# Patient Record
Sex: Male | Born: 1965 | Race: White | Hispanic: No | Marital: Married | Smoking: Never smoker
Health system: Southern US, Community
[De-identification: ages and names within clinical notes are randomized; demographics above are authoritative.]

## PROBLEM LIST (undated history)

## (undated) DIAGNOSIS — M109 Gout, unspecified: Secondary | ICD-10-CM

## (undated) DIAGNOSIS — I10 Essential (primary) hypertension: Secondary | ICD-10-CM

## (undated) DIAGNOSIS — N183 Chronic kidney disease, stage 3 unspecified: Secondary | ICD-10-CM

## (undated) DIAGNOSIS — N251 Nephrogenic diabetes insipidus: Secondary | ICD-10-CM

## (undated) DIAGNOSIS — N189 Chronic kidney disease, unspecified: Secondary | ICD-10-CM

## (undated) HISTORY — DX: Chronic kidney disease, unspecified: N18.9

## (undated) HISTORY — DX: Gout, unspecified: M10.9

## (undated) HISTORY — DX: Nephrogenic diabetes insipidus: N25.1

## (undated) HISTORY — DX: Chronic kidney disease, stage 3 unspecified: N18.30

## (undated) HISTORY — DX: Essential (primary) hypertension: I10

## (undated) HISTORY — DX: Chronic kidney disease, stage 3 (moderate): N18.3

---

## 2004-06-23 ENCOUNTER — Encounter: Admission: RE | Admit: 2004-06-23 | Discharge: 2004-06-23 | Payer: Self-pay | Admitting: Nephrology

## 2005-02-03 IMAGING — US US RETROPERITONEAL COMPLETE
1 series · 14 of 25 positions shown · non-contrast
Comparison: none

CLINICAL DATA: Chronic renal failure.  History diabetes insipidus.  
 RENAL ULTRASOUND 
 Bilateral renal size is slightly small with the right kidney in lower limits of normal of the left kidney with the right kidney measuring 8.4 cm long and the left kidney 9.7 cm long.  No hydronephrosis is seen.  Multiple bilateral renal cystic foci are seen, the largest at the lower pole right measures up to 1.7 cm and the largest at the left kidney mid region measuring up to 1.7 cm.  Urinary bladder is unremarkable. 
 IMPRESSION
 1.  Slightly smaller left kidney though no significant cortical increased echogenicity nor atrophy is seen.  
 2.  Small bilateral renal cystic foci, the largest bilaterally measuring up to 1.7 cm. 
 3.  Otherwise, no significant abnormality.

[Series 1: unknown · 0.27mm/px · 14 of 35 slices shown]
[im 1/35]
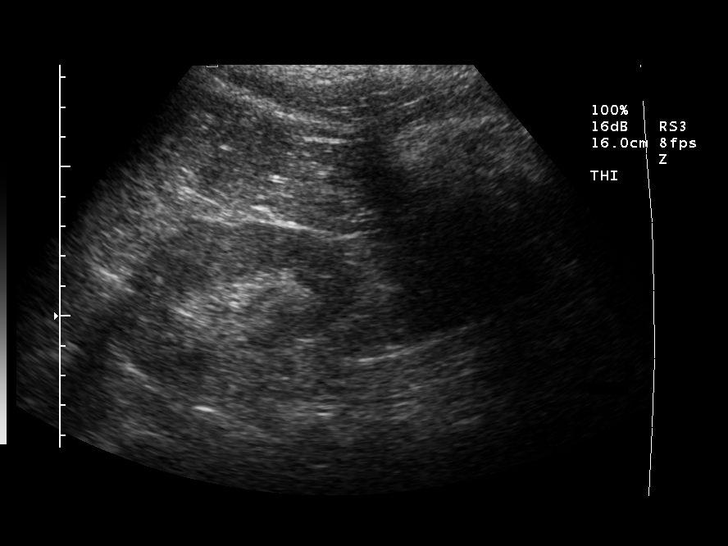
[im 3/35]
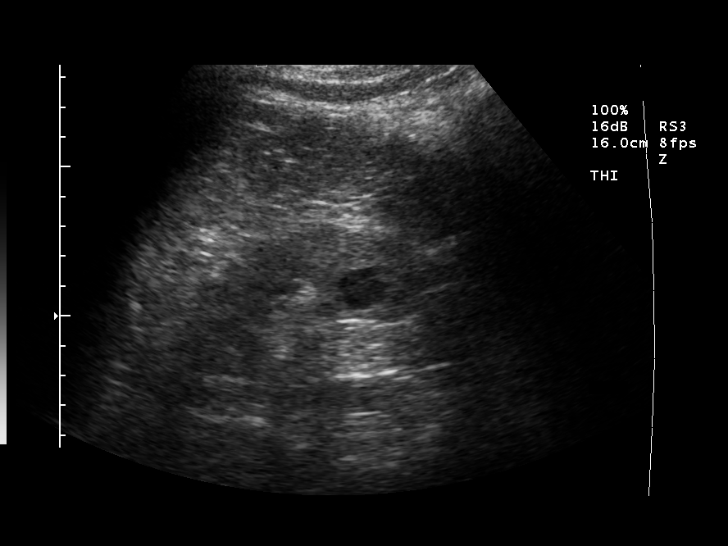
[im 6/35]
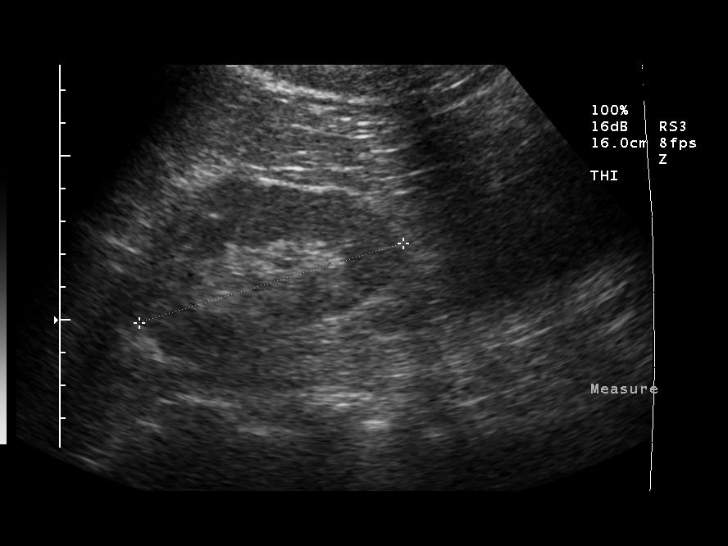
[im 9/35]
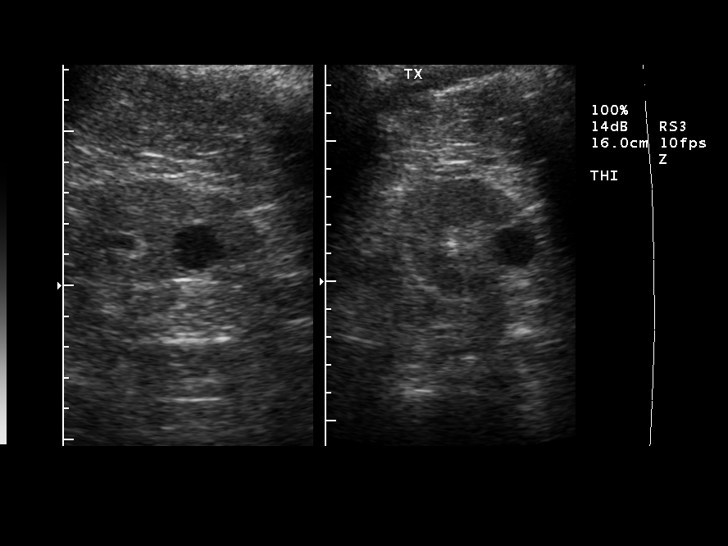
[im 12/35]
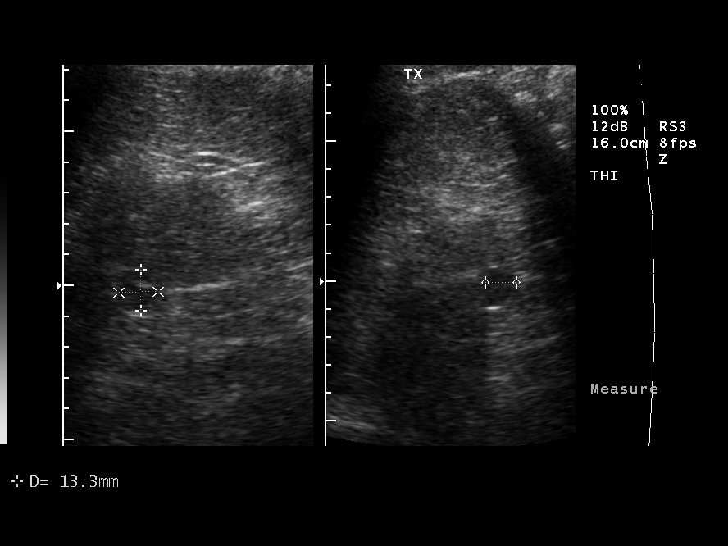
[im 13/35]
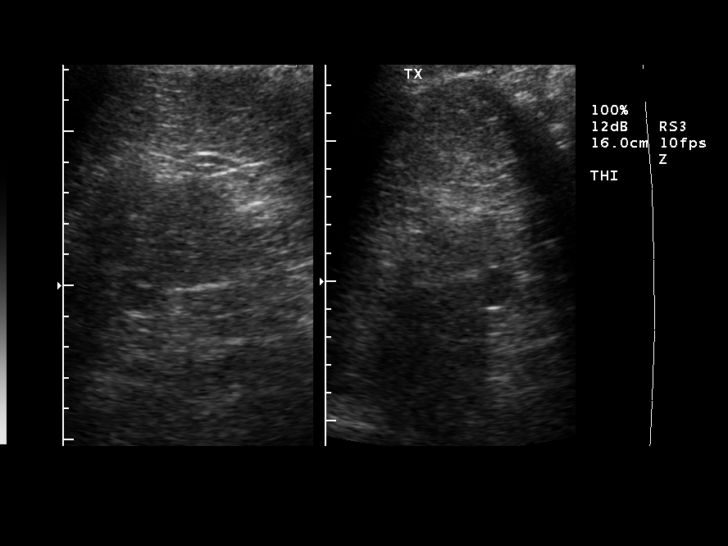
[im 16/35]
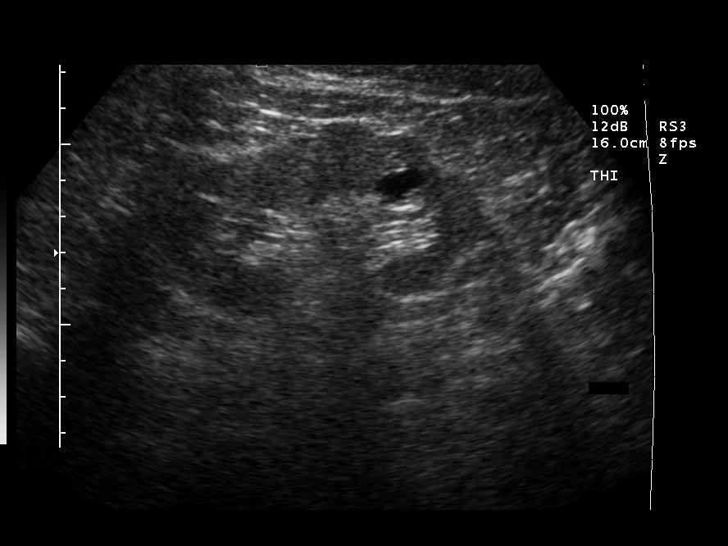
[im 19/35]
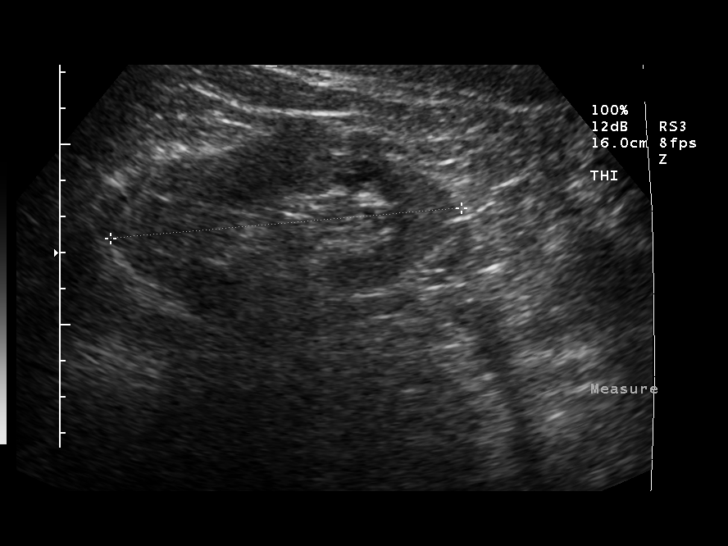
[im 22/35]
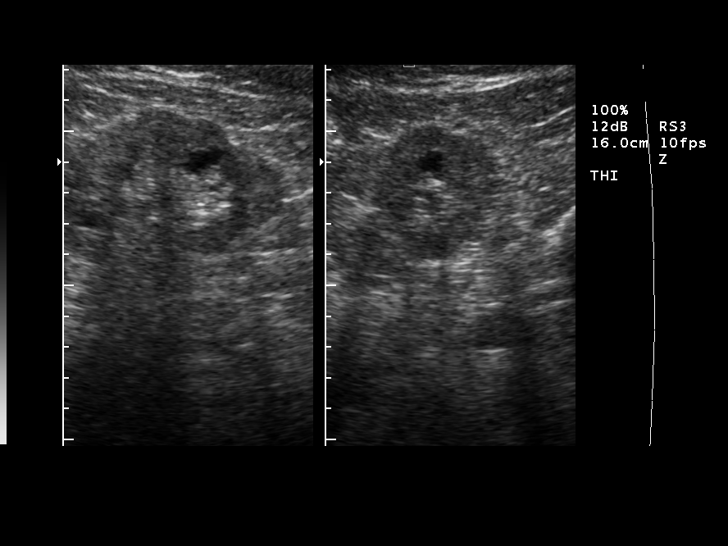
[im 23/35]
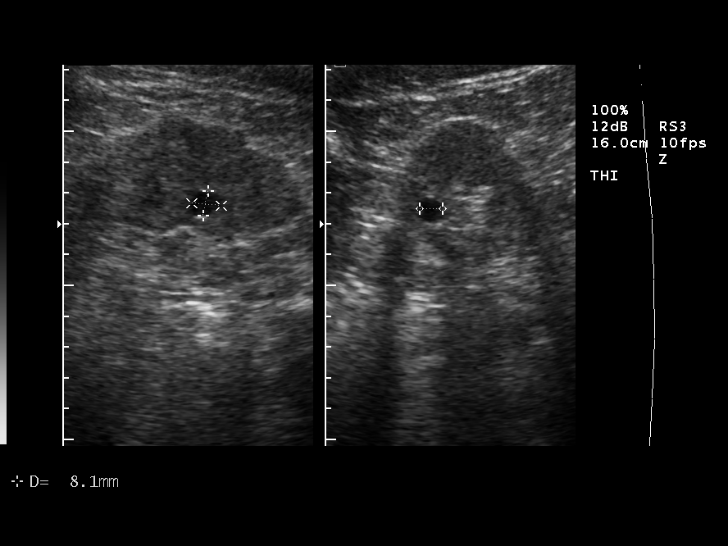
[im 26/35]
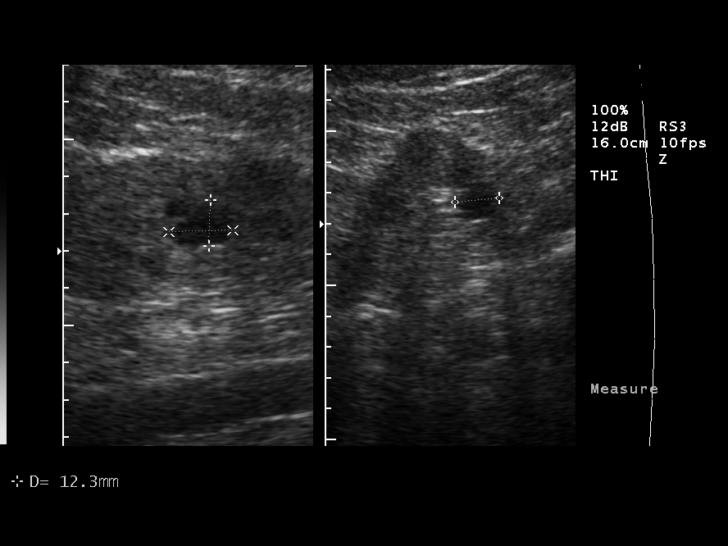
[im 29/35]
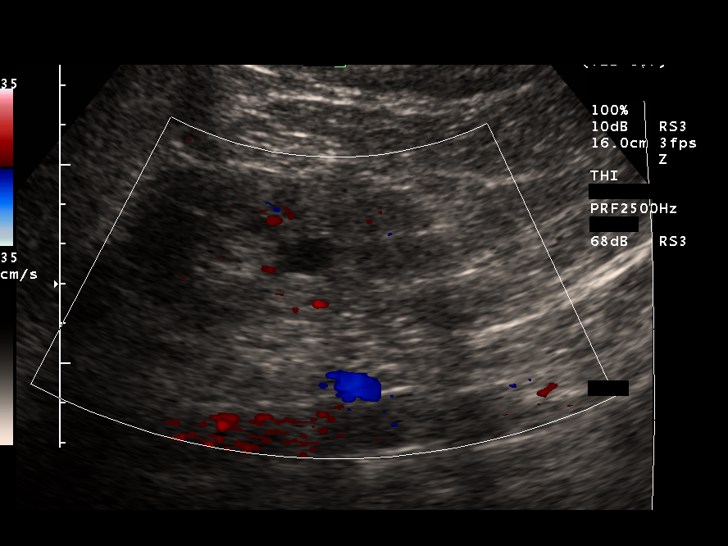
[im 32/35]
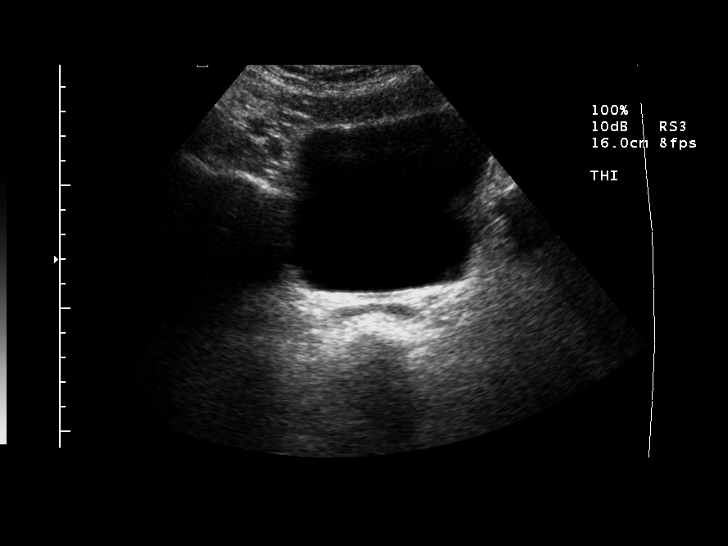
[im 35/35]
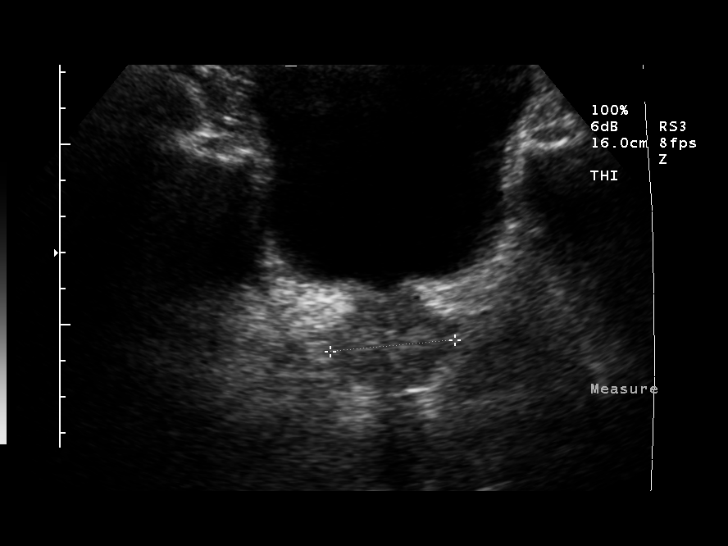

[14 of 25 positions shown; findings below may reference images not displayed]

## 2013-11-17 ENCOUNTER — Other Ambulatory Visit: Payer: Self-pay | Admitting: *Deleted

## 2013-11-17 DIAGNOSIS — Z01810 Encounter for preprocedural cardiovascular examination: Secondary | ICD-10-CM

## 2013-11-17 DIAGNOSIS — N186 End stage renal disease: Secondary | ICD-10-CM

## 2013-11-20 ENCOUNTER — Encounter: Payer: Self-pay | Admitting: Vascular Surgery

## 2013-12-10 ENCOUNTER — Encounter: Payer: Self-pay | Admitting: Vascular Surgery

## 2013-12-11 ENCOUNTER — Ambulatory Visit (HOSPITAL_COMMUNITY)
Admission: RE | Admit: 2013-12-11 | Discharge: 2013-12-11 | Disposition: A | Discharge status: Home / Self Care | Payer: 59 | Source: Ambulatory Visit | Attending: Vascular Surgery | Admitting: Vascular Surgery

## 2013-12-11 ENCOUNTER — Ambulatory Visit (INDEPENDENT_AMBULATORY_CARE_PROVIDER_SITE_OTHER)
Admission: RE | Admit: 2013-12-11 | Discharge: 2013-12-11 | Disposition: A | Discharge status: Home / Self Care | Payer: 59 | Source: Ambulatory Visit | Attending: Vascular Surgery | Admitting: Vascular Surgery

## 2013-12-11 ENCOUNTER — Ambulatory Visit (INDEPENDENT_AMBULATORY_CARE_PROVIDER_SITE_OTHER): Payer: 59 | Admitting: Vascular Surgery

## 2013-12-11 ENCOUNTER — Encounter: Payer: Self-pay | Admitting: Vascular Surgery

## 2013-12-11 VITALS — BP 143/94 | HR 52 | Ht 66.5 in | Wt 176.0 lb

## 2013-12-11 DIAGNOSIS — Z01810 Encounter for preprocedural cardiovascular examination: Secondary | ICD-10-CM

## 2013-12-11 DIAGNOSIS — N186 End stage renal disease: Secondary | ICD-10-CM

## 2013-12-11 DIAGNOSIS — N184 Chronic kidney disease, stage 4 (severe): Secondary | ICD-10-CM

## 2013-12-11 NOTE — Progress Notes (Signed)
Referred by:  Elvis CoilMartin Webb, MD  Reason for referral: New access  History of Present Illness  Richard Garcia is a 48 y.o. (1965/12/23) male who presents for evaluation for permanent access.  The patient is right hand dominant.  The patient has not had previous access procedures.  Previous central venous cannulation procedures include: none.  The patient has never had a PPM placed.  By report, his is CKD stage IV.  Past Medical History  Diagnosis Date  . Chronic kidney disease   . Hypertension   . Chronic kidney disease, stage III (moderate)   . Gout, unspecified   . Nephrogenic diabetes insipidus     History reviewed. No pertinent past surgical history.  History   Social History  . Marital Status: Married    Spouse Name: N/A    Number of Children: N/A  . Years of Education: N/A   Occupational History  . Not on file.   Social History Main Topics  . Smoking status: Never Smoker   . Smokeless tobacco: Never Used  . Alcohol Use: No  . Drug Use: No  . Sexual Activity: Not on file   Other Topics Concern  . Not on file   Social History Narrative  . No narrative on file    Family History: patient is not aware of chronic medical problems in his parents  Current Outpatient Prescriptions on File Prior to Visit  Medication Sig Dispense Refill  . allopurinol (ZYLOPRIM) 100 MG tablet Take 100 mg by mouth daily.      Marland Kitchen. b complex-vitamin c-folic acid (NEPHRO-VITE) 0.8 MG TABS tablet Take 1 tablet by mouth at bedtime.      . Cranberry Extract 250 MG TABS Take 250 mg by mouth 2 (two) times daily.      Marland Kitchen. METOPROLOL TARTRATE PO Take 50 mg by mouth daily.      . Omega-3 Fatty Acids (FISH OIL) 1000 MG CAPS Take 1,000 mg by mouth daily.       No current facility-administered medications on file prior to visit.    Allergies  Allergen Reactions  . Penicillins   . Statins      REVIEW OF SYSTEMS:  (Positives checked otherwise negative)  CARDIOVASCULAR:  []  chest pain, []   chest pressure, []  palpitations, []  shortness of breath when laying flat, []  shortness of breath with exertion,  []  pain in feet when walking, []  pain in feet when laying flat, []  history of blood clot in veins (DVT), []  history of phlebitis, []  swelling in legs, []  varicose veins  PULMONARY:  []  productive cough, []  asthma, []  wheezing  NEUROLOGIC:  []  weakness in arms or legs, []  numbness in arms or legs, []  difficulty speaking or slurred speech, []  temporary loss of vision in one eye, []  dizziness  HEMATOLOGIC:  []  bleeding problems, []  problems with blood clotting too easily  MUSCULOSKEL:  []  joint pain, []  joint swelling  GASTROINTEST:  []  vomiting blood, []  blood in stool     GENITOURINARY:  []  burning with urination, []  blood in urine  PSYCHIATRIC:  []  history of major depression  INTEGUMENTARY:  []  rashes, []  ulcers  CONSTITUTIONAL:  []  fever, []  chills   Physical Examination  Filed Vitals:   12/11/13 1509  BP: 143/94  Pulse: 52  Height: 5' 6.5" (1.689 m)  Weight: 176 lb (79.833 kg)  SpO2: 100%   Body mass index is 27.98 kg/(m^2).  General: A&O x 3, WDWN  Head: [place]/AT  Ear/Nose/Throat: Hearing  grossly intact, nares w/o erythema or drainage, oropharynx w/o Erythema/Exudate, Mallampati score: 3  Eyes: PERRLA, EOMI  Neck: Supple, no nuchal rigidity, no palpable LAD  Pulmonary: Sym exp, good air movt, CTAB, no rales, rhonchi, & wheezing  Cardiac: RRR, Nl S1, S2, no Murmurs, rubs or gallops  Vascular: Vessel Right Left  Radial Palpable Palpable  Ulnar Not Palpable Not Palpable  Brachial Palpable Palpable  Carotid Palpable, without bruit Palpable, without bruit  Aorta Not palpable N/A  Femoral Palpable Palpable  Popliteal Not palpable Not palpable  PT Faintly Palpable Faintly Palpable  DP Faintly Palpable Faintly Palpable   Gastrointestinal: soft, NTND, -G/R, - HSM, - masses, - CVAT B  Musculoskeletal: M/S 5/5 throughout , Extremities without ischemic  changes   Neurologic: CN 2-12 intact , Pain and light touch intact in extremities , Motor exam as listed above  Psychiatric: Judgment intact, Mood & affect appropriate for pt's clinical situation  Dermatologic: See M/S exam for extremity exam, no rashes otherwise noted  Lymph : No Cervical, Axillary, or Inguinal lymphadenopathy   Non-Invasive Vascular Imaging  Vein Mapping  (Date: 12/11/2013):   R arm: acceptable vein conduits include upper arm cephalic and basilic vein  L arm: acceptable vein conduits include upper arm basilic vein  Outside Studies/Documentation 4 pages of outside documents were reviewed including: outpatient nephrology chart.  Medical Decision Making  Carrick Rijos is a 48 y.o. male who presents with chronic kidney disease stage IV  Based on vein mapping and examination, this patient's permanent access options include: Left staged BVT, R BC AVF, R stage BVT  I had an extensive discussion with this patient in regards to the nature of access surgery, including risk, benefits, and alternatives.    The patient is aware that the risks of access surgery include but are not limited to: bleeding, infection, steal syndrome, nerve damage, ischemic monomelic neuropathy, failure of access to mature, and possible need for additional access procedures in the future. I discussed with the patient the nature of the staged access procedure, specifically the need for a second operation to transpose the first stage fistula if it matures adequately.    The patient has not agreed to proceed with the above procedure.  He is going consider if he want the R BC AVF vs L staged BVT.  He will be scheduled as soon as he makes a decision.  Leonides Sake, MD [facility] Office: [phone] Pager: [phone]  12/11/2013, 4:55 PM

## 2014-04-08 ENCOUNTER — Telehealth: Payer: Self-pay | Admitting: *Deleted

## 2014-04-08 NOTE — Telephone Encounter (Signed)
Left a voicemail asking the patient to call and let Okey RegalCarol know if he wants to schedule his surgery.

## 2014-11-25 ENCOUNTER — Telehealth: Payer: Self-pay

## 2014-11-25 NOTE — Telephone Encounter (Signed)
Phone call to pt. to inquire about rescheduling appt. with Dr. Imogene Burnhen to discuss hemodialysis vascular access surgery.  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

## 2020-10-19 DEATH — deceased
